# Patient Record
Sex: Female | Born: 1995 | Race: Black or African American | Hispanic: No | Smoking: Never smoker
Health system: Southern US, Community
[De-identification: ages and names within clinical notes are randomized; demographics above are authoritative.]

## PROBLEM LIST (undated history)

## (undated) ENCOUNTER — Inpatient Hospital Stay (HOSPITAL_COMMUNITY): Payer: Self-pay

## (undated) DIAGNOSIS — D649 Anemia, unspecified: Secondary | ICD-10-CM

## (undated) DIAGNOSIS — O139 Gestational [pregnancy-induced] hypertension without significant proteinuria, unspecified trimester: Secondary | ICD-10-CM

## (undated) HISTORY — PX: DENTAL SURGERY: SHX609

## (undated) HISTORY — DX: Anemia, unspecified: D64.9

---

## 2017-02-27 ENCOUNTER — Ambulatory Visit (INDEPENDENT_AMBULATORY_CARE_PROVIDER_SITE_OTHER): Payer: Self-pay

## 2017-02-27 ENCOUNTER — Encounter: Payer: Self-pay | Admitting: Family Medicine

## 2017-02-27 DIAGNOSIS — Z3201 Encounter for pregnancy test, result positive: Secondary | ICD-10-CM

## 2017-02-27 LAB — POCT PREGNANCY, URINE: Preg Test, Ur: POSITIVE — AB

## 2017-02-27 NOTE — Progress Notes (Signed)
Pt here for pregnancy test.  Resulted positive.  Pt reports LMP 11/24/16.  EDD 08/31/17.  Pt given list of medications safe to take in pregnancy.  Proof of pregnancy letter given by the front office.

## 2017-02-28 NOTE — Progress Notes (Signed)
I agree with the nurses note and plan of care.  Venia Carbonasch, Nyesha Cliff I, NP 02/28/2017 10:39 AM

## 2017-03-19 ENCOUNTER — Ambulatory Visit (INDEPENDENT_AMBULATORY_CARE_PROVIDER_SITE_OTHER): Payer: Medicaid Other | Admitting: Family Medicine

## 2017-03-19 ENCOUNTER — Encounter: Payer: Self-pay | Admitting: Family Medicine

## 2017-03-19 ENCOUNTER — Other Ambulatory Visit (HOSPITAL_COMMUNITY)
Admission: RE | Admit: 2017-03-19 | Discharge: 2017-03-19 | Disposition: A | Discharge status: Home / Self Care | Payer: Medicaid Other | Source: Ambulatory Visit | Attending: Family Medicine | Admitting: Family Medicine

## 2017-03-19 VITALS — BP 111/78 | Ht 65.0 in | Wt 220.5 lb

## 2017-03-19 DIAGNOSIS — O0990 Supervision of high risk pregnancy, unspecified, unspecified trimester: Secondary | ICD-10-CM

## 2017-03-19 DIAGNOSIS — Z9189 Other specified personal risk factors, not elsewhere classified: Secondary | ICD-10-CM | POA: Insufficient documentation

## 2017-03-19 DIAGNOSIS — O09299 Supervision of pregnancy with other poor reproductive or obstetric history, unspecified trimester: Secondary | ICD-10-CM

## 2017-03-19 DIAGNOSIS — O09292 Supervision of pregnancy with other poor reproductive or obstetric history, second trimester: Secondary | ICD-10-CM | POA: Diagnosis not present

## 2017-03-19 DIAGNOSIS — Z6221 Child in welfare custody: Secondary | ICD-10-CM

## 2017-03-19 DIAGNOSIS — O09212 Supervision of pregnancy with history of pre-term labor, second trimester: Secondary | ICD-10-CM

## 2017-03-19 DIAGNOSIS — Z8751 Personal history of pre-term labor: Secondary | ICD-10-CM

## 2017-03-19 DIAGNOSIS — O0992 Supervision of high risk pregnancy, unspecified, second trimester: Secondary | ICD-10-CM | POA: Diagnosis present

## 2017-03-19 DIAGNOSIS — Z3A16 16 weeks gestation of pregnancy: Secondary | ICD-10-CM | POA: Diagnosis not present

## 2017-03-19 MED ORDER — ASPIRIN 81 MG PO TABS: 81.0000 mg | ORAL_TABLET | Freq: Every day | ORAL | 2 refills | Status: AC

## 2017-03-19 MED ORDER — ASPIRIN 81 MG PO TABS: 81.0000 mg | ORAL_TABLET | Freq: Every day | ORAL | 2 refills | Status: DC

## 2017-03-19 MED ORDER — RANITIDINE HCL 75 MG PO TABS: 75.0000 mg | ORAL_TABLET | Freq: Two times a day (BID) | ORAL | 2 refills | Status: DC

## 2017-03-19 MED ORDER — PRENATAL VITAMINS 0.8 MG PO TABS: 1.0000 | ORAL_TABLET | Freq: Every day | ORAL | 12 refills | Status: DC

## 2017-03-19 NOTE — Patient Instructions (Signed)
Hydroxyprogesterone solution for injection What is this medicine? HYDROXYPROGESTERONE (hye drox ee proe JES ter one) is a female hormone. This medicine is used in women who are pregnant and who have delivered a baby too early (preterm) in the past. It helps lower the risk of having a preterm baby again. This medicine may be used for other purposes; ask your health care provider or pharmacist if you have questions. COMMON BRAND NAME(S): Makena What should I tell my health care provider before I take this medicine? They need to know if you have any of these conditions: -blood clotting disorders -breast, cervical, uterine, or vaginal cancer -depression -diabetes or prediabetes -heart disease -high blood pressure -kidney disease -liver disease -lung or breathing disease, like asthma -migraine headaches -seizures -vaginal bleeding -an unusual or allergic reaction to hydroxyprogesterone, other hormones, medicines, foods, dyes, castor oil, benzyl alcohol, or other preservatives -breast-feeding How should I use this medicine? This medicine is for injection into a muscle. It is given by a health care professional in a hospital or clinic setting. You are likely to get an injection once a week to prevent preterm delivery. Talk to your pediatrician regarding the use of this medicine in children. Special care may be needed. Overdosage: If you think you have taken too much of this medicine contact a poison control center or emergency room at once. NOTE: This medicine is only for you. Do not share this medicine with others. What if I miss a dose? It is important not to miss your dose. Call your doctor or health care professional if you are unable to keep an appointment. What may interact with this medicine? -acetaminophen -bupropion -clozapine -efavirenz -halothane -methadone -nicotine -theophylline, aminophylline -tizanidine This list may not describe all possible interactions. Give your  health care provider a list of all the medicines, herbs, non-prescription drugs, or dietary supplements you use. Also tell them if you smoke, drink alcohol, or use illegal drugs. Some items may interact with your medicine. What should I watch for while using this medicine? Your condition will be monitored carefully while you are receiving this medicine. What side effects may I notice from receiving this medicine? Side effects that you should report to your doctor or health care professional as soon as possible: -allergic reactions like skin rash, itching or hives, swelling of the face, lips, or tongue -breathing problems -breast tissue changes or discharge -changes in vision -confusion, trouble speaking or understanding -depressed mood -increased hunger or thirst -increased urination -pain, redness, or irritation at site where injected -pain, swelling, warmth in the leg -shortness of breath, chest pain, swelling in a leg -sudden numbness or weakness of the face, arm or leg -sudden severe headaches -trouble walking, dizziness, loss of balance or coordination -unusually weak or tired -vaginal bleeding -yellowing of the eyes or skin Side effects that usually do not require medical attention (report to your doctor or health care professional if they continue or are bothersome): -changes in emotions or moods -diarrhea -fluid retention and swelling -nausea This list may not describe all possible side effects. Call your doctor for medical advice about side effects. You may report side effects to FDA at 1-800-FDA-1088. Where should I keep my medicine? This drug is given in a hospital or clinic and will not be stored at home. NOTE: This sheet is a summary. It may not cover all possible information. If you have questions about this medicine, talk to your doctor, pharmacist, or health care provider.  2018 Elsevier/Gold Standard (2009-06-01 11:17:12)  

## 2017-03-19 NOTE — Progress Notes (Signed)
Pt leaving maternity home. Pt stated need prenatal and Iron and denied flu shot

## 2017-03-19 NOTE — Progress Notes (Signed)
Completed Makena form and faxed and explained to patient once we receive the medicine we will call her to come in to start as soon as possible since she is 16 weeks already.

## 2017-03-19 NOTE — Progress Notes (Signed)
Home Medicaid Form Completed  
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
## 2017-03-20 LAB — COMPREHENSIVE METABOLIC PANEL
A/G RATIO: 1.2 (ref 1.2–2.2)
ALBUMIN: 3.6 g/dL (ref 3.5–5.5)
ALT: 6 IU/L (ref 0–32)
AST: 12 IU/L (ref 0–40)
Alkaline Phosphatase: 68 IU/L (ref 39–117)
BILIRUBIN TOTAL: 0.3 mg/dL (ref 0.0–1.2)
BUN / CREAT RATIO: 17 (ref 9–23)
BUN: 9 mg/dL (ref 6–20)
CALCIUM: 9.4 mg/dL (ref 8.7–10.2)
CHLORIDE: 106 mmol/L (ref 96–106)
CO2: 19 mmol/L — ABNORMAL LOW (ref 20–29)
Creatinine, Ser: 0.54 mg/dL — ABNORMAL LOW (ref 0.57–1.00)
GFR, EST AFRICAN AMERICAN: 156 mL/min/{1.73_m2} (ref >59)
GFR, EST NON AFRICAN AMERICAN: 135 mL/min/{1.73_m2} (ref >59)
GLUCOSE: 86 mg/dL (ref 65–99)
Globulin, Total: 3 g/dL (ref 1.5–4.5)
Potassium: 3.9 mmol/L (ref 3.5–5.2)
Sodium: 137 mmol/L (ref 134–144)
TOTAL PROTEIN: 6.6 g/dL (ref 6.0–8.5)

## 2017-03-20 LAB — OBSTETRIC PANEL, INCLUDING HIV
ANTIBODY SCREEN: NEGATIVE
BASOS: 0 %
Basophils Absolute: 0 10*3/uL (ref 0.0–0.2)
EOS (ABSOLUTE): 0.1 10*3/uL (ref 0.0–0.4)
EOS: 1 %
HEMATOCRIT: 31.4 % — AB (ref 34.0–46.6)
HEMOGLOBIN: 10.2 g/dL — AB (ref 11.1–15.9)
HEP B S AG: NEGATIVE
HIV Screen 4th Generation wRfx: NONREACTIVE
IMMATURE GRANS (ABS): 0 10*3/uL (ref 0.0–0.1)
IMMATURE GRANULOCYTES: 0 %
LYMPHS: 21 %
Lymphocytes Absolute: 2.1 10*3/uL (ref 0.7–3.1)
MCH: 25.4 pg — ABNORMAL LOW (ref 26.6–33.0)
MCHC: 32.5 g/dL (ref 31.5–35.7)
MCV: 78 fL — ABNORMAL LOW (ref 79–97)
MONOCYTES: 5 %
Monocytes Absolute: 0.5 10*3/uL (ref 0.1–0.9)
NEUTROS PCT: 73 %
Neutrophils Absolute: 7.1 10*3/uL — ABNORMAL HIGH (ref 1.4–7.0)
Platelets: 452 10*3/uL — ABNORMAL HIGH (ref 150–379)
RBC: 4.02 x10E6/uL (ref 3.77–5.28)
RDW: 17.5 % — ABNORMAL HIGH (ref 12.3–15.4)
RH TYPE: POSITIVE
RPR Ser Ql: NONREACTIVE
RUBELLA: 16.7 {index} (ref >0.99)
WBC: 9.8 10*3/uL (ref 3.4–10.8)

## 2017-03-20 LAB — PROTEIN / CREATININE RATIO, URINE
Creatinine, Urine: 233.9 mg/dL
PROTEIN/CREAT RATIO: 122 mg/g{creat} (ref 0–200)
Protein, Ur: 28.6 mg/dL

## 2017-03-21 LAB — CYTOLOGY - PAP
Chlamydia: NEGATIVE
DIAGNOSIS: NEGATIVE
HPV (WINDOPATH): NOT DETECTED
Neisseria Gonorrhea: NEGATIVE

## 2017-03-22 ENCOUNTER — Telehealth: Payer: Self-pay

## 2017-03-22 ENCOUNTER — Other Ambulatory Visit: Payer: Self-pay | Admitting: Family Medicine

## 2017-03-22 DIAGNOSIS — Z6221 Child in welfare custody: Secondary | ICD-10-CM | POA: Insufficient documentation

## 2017-03-22 DIAGNOSIS — Z8751 Personal history of pre-term labor: Secondary | ICD-10-CM

## 2017-03-22 MED ORDER — TERCONAZOLE 0.8 % VA CREA: 1.0000 | TOPICAL_CREAM | Freq: Every day | VAGINAL | 0 refills | Status: AC

## 2017-03-22 NOTE — Progress Notes (Signed)
Terconazole vag cream called into pharmacy for yeast infection noted on pap smear.  Raynelle FanningJulie P. Sherronda Sweigert, MD OB Fellow

## 2017-03-22 NOTE — Telephone Encounter (Addendum)
Notified pt that her Donna Wall has arrived and we need to schedule an appt for her to receive her first injection.  I asked if pt could come in this week pt stated that she will have to call back to schedule because she had to check on her ride.  I advised her to call the front office and they can schedule her appt.  Pt stated understanding.

## 2017-03-23 NOTE — Progress Notes (Signed)
Patient called and left message on nurse line wanting to set up appt for injection. Called patient and informed her of yeast infection and medication sent to pharmacy. Patient verbalized understanding and states she cannot come for injection today. Offered 12/4 @ 130 for injection. Patient verbalized understanding and will come then

## 2017-03-26 ENCOUNTER — Encounter (INDEPENDENT_AMBULATORY_CARE_PROVIDER_SITE_OTHER): Payer: Self-pay

## 2017-03-26 ENCOUNTER — Encounter (HOSPITAL_COMMUNITY): Payer: Self-pay | Admitting: Family Medicine

## 2017-04-02 ENCOUNTER — Ambulatory Visit (HOSPITAL_COMMUNITY): Admission: RE | Admit: 2017-04-02 | Payer: Medicaid Other | Source: Ambulatory Visit

## 2017-04-06 ENCOUNTER — Other Ambulatory Visit (HOSPITAL_COMMUNITY): Payer: Self-pay | Admitting: *Deleted

## 2017-04-06 ENCOUNTER — Ambulatory Visit (HOSPITAL_COMMUNITY)
Admission: RE | Admit: 2017-04-06 | Discharge: 2017-04-06 | Disposition: A | Discharge status: Home / Self Care | Payer: Medicaid Other | Source: Ambulatory Visit | Attending: Family Medicine | Admitting: Family Medicine

## 2017-04-06 ENCOUNTER — Other Ambulatory Visit: Payer: Self-pay | Admitting: Family Medicine

## 2017-04-06 ENCOUNTER — Encounter (HOSPITAL_COMMUNITY): Payer: Self-pay

## 2017-04-06 DIAGNOSIS — O09899 Supervision of other high risk pregnancies, unspecified trimester: Secondary | ICD-10-CM

## 2017-04-06 DIAGNOSIS — O09299 Supervision of pregnancy with other poor reproductive or obstetric history, unspecified trimester: Secondary | ICD-10-CM | POA: Diagnosis present

## 2017-04-06 DIAGNOSIS — Z8751 Personal history of pre-term labor: Secondary | ICD-10-CM | POA: Diagnosis present

## 2017-04-06 DIAGNOSIS — Z3A19 19 weeks gestation of pregnancy: Secondary | ICD-10-CM | POA: Diagnosis not present

## 2017-04-06 DIAGNOSIS — O0990 Supervision of high risk pregnancy, unspecified, unspecified trimester: Secondary | ICD-10-CM

## 2017-04-06 DIAGNOSIS — O09219 Supervision of pregnancy with history of pre-term labor, unspecified trimester: Principal | ICD-10-CM

## 2017-04-06 NOTE — Addendum Note (Signed)
Encounter addended by: Raoul PitchMurrow, Galileo Colello Mae on: 04/06/2017 1:42 PM  Actions taken: Imaging Exam ended

## 2017-04-09 ENCOUNTER — Ambulatory Visit (INDEPENDENT_AMBULATORY_CARE_PROVIDER_SITE_OTHER): Payer: Medicaid Other | Admitting: *Deleted

## 2017-04-09 ENCOUNTER — Inpatient Hospital Stay (HOSPITAL_COMMUNITY)
Admission: AD | Admit: 2017-04-09 | Discharge: 2017-04-09 | Disposition: A | Discharge status: Home / Self Care | Payer: Medicaid Other | Source: Ambulatory Visit | Attending: Obstetrics and Gynecology | Admitting: Obstetrics and Gynecology

## 2017-04-09 ENCOUNTER — Encounter (HOSPITAL_COMMUNITY): Payer: Self-pay

## 2017-04-09 VITALS — BP 100/63 | HR 78

## 2017-04-09 DIAGNOSIS — O09212 Supervision of pregnancy with history of pre-term labor, second trimester: Secondary | ICD-10-CM

## 2017-04-09 DIAGNOSIS — M545 Low back pain: Secondary | ICD-10-CM | POA: Diagnosis not present

## 2017-04-09 DIAGNOSIS — O26892 Other specified pregnancy related conditions, second trimester: Secondary | ICD-10-CM | POA: Insufficient documentation

## 2017-04-09 DIAGNOSIS — Z3A19 19 weeks gestation of pregnancy: Secondary | ICD-10-CM

## 2017-04-09 DIAGNOSIS — O9989 Other specified diseases and conditions complicating pregnancy, childbirth and the puerperium: Secondary | ICD-10-CM | POA: Diagnosis not present

## 2017-04-09 DIAGNOSIS — R1030 Lower abdominal pain, unspecified: Secondary | ICD-10-CM | POA: Insufficient documentation

## 2017-04-09 DIAGNOSIS — B379 Candidiasis, unspecified: Secondary | ICD-10-CM

## 2017-04-09 DIAGNOSIS — O99891 Other specified diseases and conditions complicating pregnancy: Secondary | ICD-10-CM

## 2017-04-09 DIAGNOSIS — N949 Unspecified condition associated with female genital organs and menstrual cycle: Secondary | ICD-10-CM

## 2017-04-09 DIAGNOSIS — R102 Pelvic and perineal pain unspecified side: Secondary | ICD-10-CM | POA: Diagnosis present

## 2017-04-09 DIAGNOSIS — M549 Dorsalgia, unspecified: Secondary | ICD-10-CM

## 2017-04-09 DIAGNOSIS — Z7982 Long term (current) use of aspirin: Secondary | ICD-10-CM | POA: Insufficient documentation

## 2017-04-09 DIAGNOSIS — Z8751 Personal history of pre-term labor: Secondary | ICD-10-CM

## 2017-04-09 HISTORY — DX: Gestational (pregnancy-induced) hypertension without significant proteinuria, unspecified trimester: O13.9

## 2017-04-09 LAB — URINALYSIS, ROUTINE W REFLEX MICROSCOPIC
BILIRUBIN URINE: NEGATIVE
GLUCOSE, UA: NEGATIVE mg/dL
HGB URINE DIPSTICK: NEGATIVE
KETONES UR: 20 mg/dL — AB
Leukocytes, UA: NEGATIVE
Nitrite: NEGATIVE
PROTEIN: NEGATIVE mg/dL
Specific Gravity, Urine: 1.028 (ref 1.005–1.030)
pH: 5 (ref 5.0–8.0)

## 2017-04-09 MED ORDER — CYCLOBENZAPRINE HCL 10 MG PO TABS
10.0000 mg | ORAL_TABLET | Freq: Once | ORAL | Status: AC
  Administered 2017-04-09: 10 mg via ORAL
  Filled 2017-04-09: qty 1

## 2017-04-09 MED ORDER — MONISTAT 7 COMPLETE THERAPY 100-2 MG-% VA KIT: 1.0000 | PACK | Freq: Every day | VAGINAL | 0 refills | Status: AC

## 2017-04-09 MED ORDER — CYCLOBENZAPRINE HCL 10 MG PO TABS: 10.0000 mg | ORAL_TABLET | Freq: Two times a day (BID) | ORAL | 0 refills | Status: AC | PRN

## 2017-04-09 MED ORDER — HYDROXYPROGESTERONE CAPROATE 275 MG/1.1ML ~~LOC~~ SOAJ
275.0000 mg | Freq: Once | SUBCUTANEOUS | Status: AC
  Administered 2017-04-09: 275 mg via SUBCUTANEOUS

## 2017-04-09 NOTE — MAU Provider Note (Signed)
History     CSN: [id number]  Arrival date and time: 04/09/17 1549   First Provider Initiated Contact with Patient 04/09/17 1627      Chief Complaint  Patient presents with  . Back Pain  . Abdominal Pain   HPI   Ms. Donna Wall is a 21 y.o. [id number] at 41w3dgestation by LMP presenting to MAU with complaints of lower abdominal pain and mid to lower back pain since 1430 today while at dentist appt. She reports "losing mucous plug" lots of green vaginal d/c. Denies VB or LOF. [place] [facility] [place] [place] [facility] 17-P weekly injection. When she mentioned the mucous d/c to the RN, she was told that it was "not a big deal". She is concerned that something is wrong, because "when she lost mucous plug with daughter, she had her (preterm) a week later".  Past Medical History:  Diagnosis Date  . Anemia   . Pregnancy induced hypertension     Past Surgical History:  Procedure Laterality Date  . DENTAL SURGERY      Family History  Problem Relation Age of Onset  . Hypertension Mother   . High Cholesterol Mother     Social History   Tobacco Use  . Smoking status: Never Smoker  . Smokeless tobacco: Never Used  Substance Use Topics  . Alcohol use: No    Frequency: Never  . Drug use: No    Allergies: No Known Allergies  Medications Prior to Admission  Medication Sig Dispense Refill Last Dose  . aspirin 81 MG tablet Take 1 tablet (81 mg total) by mouth daily. 90 tablet 2 04/09/2017 at Unknown time  . Miconazole Nitrate-Wipes (MONISTAT 7 COMPLETE THERAPY) 100-2 MG-% KIT Place 1 Applicatorful vaginally at bedtime for 7 days. 1 kit 0 not started at Unknown time  . Prenatal Multivit-Min-Fe-FA (PRENATAL VITAMINS) 0.8 MG tablet Take 1 tablet by mouth daily. 30 tablet 12 04/09/2017 at Unknown time  . ranitidine (ZANTAC 75) 75 MG tablet Take 1 tablet (75 mg total) by mouth 2 (two) times daily. 90 tablet 2 04/09/2017 at Unknown time    Review of Systems  Constitutional: Negative.    HENT: Negative.   Eyes: Negative.   Respiratory: Negative.   Cardiovascular: Negative.   Gastrointestinal: Positive for abdominal pain (lower abdominal pain).  Endocrine: Negative.   Genitourinary: Positive for pelvic pain and vaginal discharge.  Musculoskeletal: Positive for back pain.  Skin: Negative.   Allergic/Immunologic: Negative.   Neurological: Negative.   Hematological: Negative.   Psychiatric/Behavioral: Negative.    Physical Exam   Blood pressure 126/77, pulse (!) 107, temperature 98.6 F (37 C), resp. rate 18, height _0  (1.651 m), weight 229 lb (103.9 kg), last menstrual period 11/24/2016.  Physical Exam  Nursing note and vitals reviewed. Constitutional: She is oriented to person, place, and time. She appears well-developed and well-nourished.  HENT:  Head: Normocephalic.  Eyes: Pupils are equal, round, and reactive to light.  Neck: Normal range of motion.  Cardiovascular: Normal rate, regular rhythm and normal heart sounds.  Respiratory: Effort normal and breath sounds normal.  GI: Soft. Bowel sounds are normal.  Genitourinary:  Genitourinary Comments: Uterus: non-tender, cx: smooth, pink, no lesions, moderate amt of thick, mucoid, whitish-yellow vaginal d/c, closed/long/firm, no CMT or friability, no adnexal tenderness   Musculoskeletal: Normal range of motion.  Neurological: She is alert and oriented to person, place, and time.  Skin: Skin is warm and dry.  Psychiatric: She has a normal mood  and affect. Her behavior is normal. Judgment and thought content normal.    MAU Course  Procedures  MDM CCUA FHTs by doppler: 154 bpm  Results for orders placed or performed during the hospital encounter of 04/09/17 (from the past 24 hour(s))  Urinalysis, Routine w reflex microscopic     Status: Abnormal   Collection Time: 04/09/17  4:13 PM  Result Value Ref Range   Color, Urine YELLOW YELLOW   APPearance HAZY (A) CLEAR   Specific Gravity, Urine 1.028 1.005 -  1.030   pH 5.0 5.0 - 8.0   Glucose, UA NEGATIVE NEGATIVE mg/dL   Hgb urine dipstick NEGATIVE NEGATIVE   Bilirubin Urine NEGATIVE NEGATIVE   Ketones, ur 20 (A) NEGATIVE mg/dL   Protein, ur NEGATIVE NEGATIVE mg/dL   Nitrite NEGATIVE NEGATIVE   Leukocytes, UA NEGATIVE NEGATIVE     Assessment and Plan  Back pain affecting pregnancy in second trimester - Rx for Flexeril 10 mg po BID - Information provided on back pain in pregnancy   Pelvic pressure in pregnancy, antepartum, second trimester - Continue 17-P weekly injections  - Keep your scheduled appts  Discharge home Patient verbalized an understanding of the plan of care and agrees.   Laury Deep, MSN, CNM 04/09/2017, 5:36 PM

## 2017-04-09 NOTE — Progress Notes (Signed)
Pt to clinic for 17-p injection, tolerated well. Also requested med for yeast infection, stated terconazole that was prescribed cause her to break out. Monistat 7 sent to her pharmacy.

## 2017-04-09 NOTE — MAU Note (Signed)
Pt reports she is having waves of pain in her back and lower abd pain. abd pain since this morning back pain about 1.5 hours ago. Denies any vaginal bleeding but reports some mucusy discharge(possoble mucus plug). In office today for 17 p shot.

## 2017-04-18 ENCOUNTER — Ambulatory Visit (INDEPENDENT_AMBULATORY_CARE_PROVIDER_SITE_OTHER): Payer: Medicaid Other | Admitting: Family Medicine

## 2017-04-18 ENCOUNTER — Encounter: Payer: Self-pay | Admitting: General Practice

## 2017-04-18 VITALS — BP 119/65 | HR 93 | Wt 232.3 lb

## 2017-04-18 DIAGNOSIS — O09212 Supervision of pregnancy with history of pre-term labor, second trimester: Secondary | ICD-10-CM

## 2017-04-18 DIAGNOSIS — O0992 Supervision of high risk pregnancy, unspecified, second trimester: Secondary | ICD-10-CM

## 2017-04-18 DIAGNOSIS — O09219 Supervision of pregnancy with history of pre-term labor, unspecified trimester: Secondary | ICD-10-CM

## 2017-04-18 DIAGNOSIS — O09899 Supervision of other high risk pregnancies, unspecified trimester: Secondary | ICD-10-CM

## 2017-04-18 DIAGNOSIS — O0990 Supervision of high risk pregnancy, unspecified, unspecified trimester: Secondary | ICD-10-CM

## 2017-04-18 MED ORDER — HYDROXYPROGESTERONE CAPROATE 275 MG/1.1ML ~~LOC~~ SOAJ
275.0000 mg | Freq: Once | SUBCUTANEOUS | Status: AC
  Administered 2017-04-18: 275 mg via SUBCUTANEOUS

## 2017-04-18 NOTE — Patient Instructions (Signed)
Eating Plan for Pregnant Women While you are pregnant, your body will require additional nutrition to help support your growing baby. It is recommended that you consume:  150 additional calories each day during your first trimester.  300 additional calories each day during your second trimester.  300 additional calories each day during your third trimester.  Eating a healthy, well-balanced diet is very important for your health and for your baby's health. You also have a higher need for some vitamins and minerals, such as folic acid, calcium, iron, and vitamin D. What do I need to know about eating during pregnancy?  Do not try to lose weight or go on a diet during pregnancy.  Choose healthy, nutritious foods. Choose  of a sandwich with a glass of milk instead of a candy bar or a high-calorie sugar-sweetened beverage.  Limit your overall intake of foods that have "empty calories." These are foods that have little nutritional value, such as sweets, desserts, candies, sugar-sweetened beverages, and fried foods.  Eat a variety of foods, especially fruits and vegetables.  Take a prenatal vitamin to help meet the additional needs during pregnancy, specifically for folic acid, iron, calcium, and vitamin D.  Remember to stay active. Ask your health care provider for exercise recommendations that are specific to you.  Practice good food safety and cleanliness, such as washing your hands before you eat and after you prepare raw meat. This helps to prevent foodborne illnesses, such as listeriosis, that can be very dangerous for your baby. Ask your health care provider for more information about listeriosis. What does 150 extra calories look like? Healthy options for an additional 150 calories each day could be any of the following:  Plain low-fat yogurt (6-8 oz) with  cup of berries.  1 apple with 2 teaspoons of peanut butter.  Cut-up vegetables with  cup of hummus.  Low-fat chocolate  milk (8 oz or 1 cup).  1 string cheese with 1 medium orange.   of a peanut butter and jelly sandwich on whole-wheat bread (1 tsp of peanut butter).  For 300 calories, you could eat two of those healthy options each day. What is a healthy amount of weight to gain? The recommended amount of weight for you to gain is based on your pre-pregnancy BMI. If your pre-pregnancy BMI was:  Less than 18 (underweight), you should gain 28-40 lb.  18-24.9 (normal), you should gain 25-35 lb.  25-29.9 (overweight), you should gain 15-25 lb.  Greater than 30 (obese), you should gain 11-20 lb.  What if I am having twins or multiples? Generally, pregnant women who will be having twins or multiples may need to increase their daily calories by 300-600 calories each day. The recommended range for total weight gain is 25-54 lb, depending on your pre-pregnancy BMI. Talk with your health care provider for specific guidance about additional nutritional needs, weight gain, and exercise during your pregnancy. What foods can I eat? Grains Any grains. Try to choose whole grains, such as whole-wheat bread, oatmeal, or brown rice. Vegetables Any vegetables. Try to eat a variety of colors and types of vegetables to get a full range of vitamins and minerals. Remember to wash your vegetables well before eating. Fruits Any fruits. Try to eat a variety of colors and types of fruit to get a full range of vitamins and minerals. Remember to wash your fruits well before eating. Meats and Other Protein Sources Lean meats, including chicken, turkey, fish, and lean cuts of beef, veal,   or pork. Make sure that all meats are cooked to "well done." Tofu. Tempeh. Beans. Eggs. Peanut butter and other nut butters. Seafood, such as shrimp, crab, and lobster. If you choose fish, select types that are higher in omega-3 fatty acids, including salmon, herring, mussels, trout, sardines, and pollock. Make sure that all meats are cooked to  food-safe temperatures. Dairy Pasteurized milk and milk alternatives. Pasteurized yogurt and pasteurized cheese. Cottage cheese. Sour cream. Beverages Water. Juices that contain 100% fruit juice or vegetable juice. Caffeine-free teas and decaffeinated coffee. Drinks that contain caffeine are okay to drink, but it is better to avoid caffeine. Keep your total caffeine intake to less than 200 mg each day (12 oz of coffee, tea, or soda) or as directed by your health care provider. Condiments Any pasteurized condiments. Sweets and Desserts Any sweets and desserts. Fats and Oils Any fats and oils. The items listed above may not be a complete list of recommended foods or beverages. Contact your dietitian for more options. What foods are not recommended? Vegetables Unpasteurized (raw) vegetable juices. Fruits Unpasteurized (raw) fruit juices. Meats and Other Protein Sources Cured meats that have nitrates, such as bacon, salami, and hotdogs. Luncheon meats, bologna, or other deli meats (unless they are reheated until they are steaming hot). Refrigerated pate, meat spreads from a meat counter, smoked seafood that is found in the refrigerated section of a store. Raw fish, such as sushi or sashimi. High mercury content fish, such as tilefish, shark, swordfish, and king mackerel. Raw meats, such as tuna or beef tartare. Undercooked meats and poultry. Make sure that all meats are cooked to food-safe temperatures. Dairy Unpasteurized (raw) milk and any foods that have raw milk in them. Soft cheeses, such as feta, queso blanco, queso fresco, Brie, Camembert cheeses, blue-veined cheeses, and Panela cheese (unless it is made with pasteurized milk, which must be stated on the label). Beverages Alcohol. Sugar-sweetened beverages, such as sodas, teas, or energy drinks. Condiments Homemade fermented foods and drinks, such as pickles, sauerkraut, or kombucha drinks. (Store-bought pasteurized versions of these are  okay.) Other Salads that are made in the store, such as ham salad, chicken salad, egg salad, tuna salad, and seafood salad. The items listed above may not be a complete list of foods and beverages to avoid. Contact your dietitian for more information. This information is not intended to replace advice given to you by your health care provider. Make sure you discuss any questions you have with your health care provider. Document Released: 01/23/2014 Document Revised: 09/16/2015 Document Reviewed: 09/23/2013 Elsevier Interactive Patient Education  2018 [employer]   
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
## 2017-04-18 NOTE — Addendum Note (Signed)
Addended by: Lorelle GibbsWILSON, Calianne Larue L on: 04/18/2017 12:49 PM   Modules accepted: Orders

## 2017-04-20 LAB — AFP TETRA
DIA Mom Value: 0.93
DIA Value (EIA): 142.67 pg/mL
DSR (BY AGE) 1 IN: 1121
DSR (SECOND TRIMESTER) 1 IN: 10000
Gestational Age: 20 WEEKS
MSAFP MOM: 1.34
MSAFP: 63.2 ng/mL
MSHCG Mom: 0.46
MSHCG: 8115 m[IU]/mL
Maternal Age At EDD: 22.3 yr
OSB RISK: 8546
T18 (By Age): 1:4368 {titer}
TEST RESULTS AFP: NEGATIVE
WEIGHT: 232 [lb_av]
uE3 Mom: 1.17
uE3 Value: 1.9 ng/mL

## 2017-04-23 ENCOUNTER — Ambulatory Visit (HOSPITAL_COMMUNITY)
Admission: RE | Admit: 2017-04-23 | Discharge: 2017-04-23 | Disposition: A | Discharge status: Home / Self Care | Payer: Medicaid Other | Source: Ambulatory Visit | Attending: Family Medicine | Admitting: Family Medicine

## 2017-04-23 ENCOUNTER — Other Ambulatory Visit (HOSPITAL_COMMUNITY): Payer: Self-pay | Admitting: *Deleted

## 2017-04-23 ENCOUNTER — Encounter (HOSPITAL_COMMUNITY): Payer: Self-pay

## 2017-04-23 ENCOUNTER — Other Ambulatory Visit (HOSPITAL_COMMUNITY): Payer: Self-pay | Admitting: Obstetrics and Gynecology

## 2017-04-23 DIAGNOSIS — O09219 Supervision of pregnancy with history of pre-term labor, unspecified trimester: Principal | ICD-10-CM

## 2017-04-23 DIAGNOSIS — O09299 Supervision of pregnancy with other poor reproductive or obstetric history, unspecified trimester: Secondary | ICD-10-CM | POA: Insufficient documentation

## 2017-04-23 DIAGNOSIS — Z3686 Encounter for antenatal screening for cervical length: Secondary | ICD-10-CM | POA: Diagnosis not present

## 2017-04-23 DIAGNOSIS — O09899 Supervision of other high risk pregnancies, unspecified trimester: Secondary | ICD-10-CM

## 2017-04-23 DIAGNOSIS — Z3A21 21 weeks gestation of pregnancy: Secondary | ICD-10-CM

## 2017-05-07 ENCOUNTER — Encounter (HOSPITAL_COMMUNITY): Payer: Self-pay

## 2017-05-07 ENCOUNTER — Other Ambulatory Visit (HOSPITAL_COMMUNITY): Payer: Self-pay | Admitting: Maternal and Fetal Medicine

## 2017-05-07 ENCOUNTER — Ambulatory Visit (HOSPITAL_COMMUNITY)
Admission: RE | Admit: 2017-05-07 | Discharge: 2017-05-07 | Disposition: A | Discharge status: Home / Self Care | Payer: Medicaid Other | Source: Ambulatory Visit | Attending: Family Medicine | Admitting: Family Medicine

## 2017-05-07 ENCOUNTER — Other Ambulatory Visit (HOSPITAL_COMMUNITY): Payer: Self-pay | Admitting: *Deleted

## 2017-05-07 DIAGNOSIS — O09212 Supervision of pregnancy with history of pre-term labor, second trimester: Secondary | ICD-10-CM | POA: Diagnosis not present

## 2017-05-07 DIAGNOSIS — Z3A23 23 weeks gestation of pregnancy: Secondary | ICD-10-CM | POA: Diagnosis not present

## 2017-05-07 DIAGNOSIS — M549 Dorsalgia, unspecified: Secondary | ICD-10-CM

## 2017-05-07 DIAGNOSIS — N949 Unspecified condition associated with female genital organs and menstrual cycle: Secondary | ICD-10-CM

## 2017-05-07 DIAGNOSIS — O9989 Other specified diseases and conditions complicating pregnancy, childbirth and the puerperium: Secondary | ICD-10-CM

## 2017-05-07 DIAGNOSIS — Z8751 Personal history of pre-term labor: Secondary | ICD-10-CM

## 2017-05-07 DIAGNOSIS — Z3686 Encounter for antenatal screening for cervical length: Secondary | ICD-10-CM | POA: Diagnosis not present

## 2017-05-07 DIAGNOSIS — O09219 Supervision of pregnancy with history of pre-term labor, unspecified trimester: Secondary | ICD-10-CM

## 2017-05-07 DIAGNOSIS — O26892 Other specified pregnancy related conditions, second trimester: Secondary | ICD-10-CM

## 2017-05-07 DIAGNOSIS — O09292 Supervision of pregnancy with other poor reproductive or obstetric history, second trimester: Secondary | ICD-10-CM | POA: Diagnosis not present

## 2017-05-07 DIAGNOSIS — R102 Pelvic and perineal pain: Secondary | ICD-10-CM

## 2017-05-07 NOTE — ED Notes (Signed)
Patient states she is receiving weekly 17P injs., but it is not on her med list.

## 2017-05-07 NOTE — Addendum Note (Signed)
Encounter addended by: Lenise ArenaBazemore, Tamra Koos, RDMS on: 05/07/2017 9:52 AM  Actions taken: Imaging Exam ended

## 2017-05-09 ENCOUNTER — Encounter: Payer: Self-pay | Admitting: *Deleted

## 2017-05-15 ENCOUNTER — Encounter: Payer: Medicaid Other | Admitting: Family Medicine

## 2017-05-21 ENCOUNTER — Other Ambulatory Visit (HOSPITAL_COMMUNITY): Payer: Self-pay | Admitting: Obstetrics and Gynecology

## 2017-05-21 ENCOUNTER — Encounter (HOSPITAL_COMMUNITY): Payer: Self-pay

## 2017-05-21 ENCOUNTER — Other Ambulatory Visit (HOSPITAL_COMMUNITY): Payer: Self-pay | Admitting: *Deleted

## 2017-05-21 ENCOUNTER — Ambulatory Visit (HOSPITAL_COMMUNITY)
Admission: RE | Admit: 2017-05-21 | Discharge: 2017-05-21 | Disposition: A | Discharge status: Home / Self Care | Payer: Medicaid Other | Source: Ambulatory Visit | Attending: Family Medicine | Admitting: Family Medicine

## 2017-05-21 ENCOUNTER — Encounter (INDEPENDENT_AMBULATORY_CARE_PROVIDER_SITE_OTHER): Payer: Self-pay

## 2017-05-21 DIAGNOSIS — O99212 Obesity complicating pregnancy, second trimester: Secondary | ICD-10-CM | POA: Diagnosis not present

## 2017-05-21 DIAGNOSIS — O09299 Supervision of pregnancy with other poor reproductive or obstetric history, unspecified trimester: Secondary | ICD-10-CM

## 2017-05-21 DIAGNOSIS — O09212 Supervision of pregnancy with history of pre-term labor, second trimester: Secondary | ICD-10-CM | POA: Insufficient documentation

## 2017-05-21 DIAGNOSIS — Z3A25 25 weeks gestation of pregnancy: Secondary | ICD-10-CM

## 2017-05-21 DIAGNOSIS — O09292 Supervision of pregnancy with other poor reproductive or obstetric history, second trimester: Secondary | ICD-10-CM | POA: Insufficient documentation

## 2017-05-21 DIAGNOSIS — Z3686 Encounter for antenatal screening for cervical length: Secondary | ICD-10-CM

## 2017-05-21 DIAGNOSIS — O09899 Supervision of other high risk pregnancies, unspecified trimester: Secondary | ICD-10-CM

## 2017-05-21 DIAGNOSIS — Z362 Encounter for other antenatal screening follow-up: Secondary | ICD-10-CM | POA: Diagnosis not present

## 2017-05-21 DIAGNOSIS — Z8751 Personal history of pre-term labor: Secondary | ICD-10-CM

## 2017-05-21 DIAGNOSIS — O09219 Supervision of pregnancy with history of pre-term labor, unspecified trimester: Principal | ICD-10-CM

## 2017-05-21 NOTE — Telephone Encounter (Signed)
mychart message sent to pt to call us for apt

## 2017-05-22 ENCOUNTER — Telehealth: Payer: Self-pay | Admitting: Family Medicine

## 2017-05-22 NOTE — Telephone Encounter (Signed)
Called patient to get her scheduled, she said she was moving from [place] and she will have her records requested, no other appts here in our office

## 2017-05-27 ENCOUNTER — Encounter (HOSPITAL_COMMUNITY): Payer: Self-pay | Admitting: *Deleted

## 2017-05-27 ENCOUNTER — Inpatient Hospital Stay (HOSPITAL_COMMUNITY)
Admission: AD | Admit: 2017-05-27 | Discharge: 2017-05-27 | Disposition: A | Discharge status: Home / Self Care | Payer: Medicaid Other | Source: Ambulatory Visit | Attending: Obstetrics & Gynecology | Admitting: Obstetrics & Gynecology

## 2017-05-27 DIAGNOSIS — Z3A26 26 weeks gestation of pregnancy: Secondary | ICD-10-CM | POA: Diagnosis not present

## 2017-05-27 DIAGNOSIS — R102 Pelvic and perineal pain: Secondary | ICD-10-CM | POA: Insufficient documentation

## 2017-05-27 DIAGNOSIS — O26892 Other specified pregnancy related conditions, second trimester: Secondary | ICD-10-CM | POA: Insufficient documentation

## 2017-05-27 DIAGNOSIS — Z7982 Long term (current) use of aspirin: Secondary | ICD-10-CM | POA: Diagnosis not present

## 2017-05-27 DIAGNOSIS — R1030 Lower abdominal pain, unspecified: Secondary | ICD-10-CM | POA: Diagnosis present

## 2017-05-27 DIAGNOSIS — N949 Unspecified condition associated with female genital organs and menstrual cycle: Secondary | ICD-10-CM

## 2017-05-27 LAB — URINALYSIS, ROUTINE W REFLEX MICROSCOPIC
Bilirubin Urine: NEGATIVE
Glucose, UA: NEGATIVE mg/dL
Hgb urine dipstick: NEGATIVE
KETONES UR: NEGATIVE mg/dL
Nitrite: NEGATIVE
PROTEIN: NEGATIVE mg/dL
Specific Gravity, Urine: 1.027 (ref 1.005–1.030)
pH: 6 (ref 5.0–8.0)

## 2017-05-27 MED ORDER — COMFORT FIT MATERNITY SUPP MED MISC: 1 refills | Status: AC

## 2017-05-27 NOTE — Discharge Instructions (Signed)
You have pubic symphysis pain which is a normal discomfort of pregnancy. Get a maternity support belt and wear it for less pain but it will not take away all of your pain. It is important for you to keep moving and to do some stretching exercises every day. Be sure to be in touch with your social worker. Make an appointment and be seen in the clinic if you are in [place].

## 2017-05-27 NOTE — MAU Note (Signed)
Pt presents with c/o pain in sharp right hip that shoots down her leg and into her foot with movement that began last week. Also reports lower abdominal cramping.  Denies VB or LOF.  Reports +FM.

## 2017-05-27 NOTE — MAU Provider Note (Signed)
History     CSN: [id number]  Arrival date and time: 05/27/17 1236   First Provider Initiated Contact with Patient 05/27/17 1322      Chief Complaint  Patient presents with  . Abdominal Pain  . Hip Pain  . Leg Pain   HPI Donna Wall 22 y.o. [redacted]w[redacted]d  Comes to MAU today with lower abdominal pain and pain in her right leg that goes from her hip to her anterior thigh and to her knee.  Has pain when walking and when lying on her side in bed.  Has taken 17P shots in this pregnancy but none in the past 4 weeks.  Has had 2 preterm births at 34 and 29 weeks.  Told the clinic she was moving and has not been seen in the clinic since Dec 26.  Client is talking almost constantly.  States she does not know what she is going to do.  She did leave [place] but then friends picked her up and returned her to [place], but she has no where to stay.  States her support in this pregnancy is uncertain.  Currently she is living at [employer] and has a Child psychotherapist who is helping her fill out paperwork to find somewhere to live in [place] during the pregnancy.  Client has her debit card lying beside the bed and states she was planning to use it to purchase food.  OB History    Gravida Para Term Preterm AB Living   3 2   2   2    SAB TAB Ectopic Multiple Live Births           2      Obstetric Comments   2016 - Induced for Pre-eclampsia  2017 - PTL      Past Medical History:  Diagnosis Date  . Anemia   . Pregnancy induced hypertension     Past Surgical History:  Procedure Laterality Date  . DENTAL SURGERY      Family History  Problem Relation Age of Onset  . Hypertension Mother   . High Cholesterol Mother   . Hypertension Maternal Grandmother   . Diabetes Maternal Grandfather     Social History   Tobacco Use  . Smoking status: Never Smoker  . Smokeless tobacco: Never Used  Substance Use Topics  . Alcohol use: No    Frequency: Never  . Drug use: No    Allergies: No Known  Allergies  Medications Prior to Admission  Medication Sig Dispense Refill Last Dose  . aspirin 81 MG tablet Take 1 tablet (81 mg total) by mouth daily. 90 tablet 2 Taking  . cyclobenzaprine (FLEXERIL) 10 MG tablet Take 1 tablet (10 mg total) by mouth 2 (two) times daily as needed for muscle spasms. 20 tablet 0 Taking  . Prenatal Multivit-Min-Fe-FA (PRENATAL VITAMINS) 0.8 MG tablet Take 1 tablet by mouth daily. 30 tablet 12 Taking  . ranitidine (ZANTAC 75) 75 MG tablet Take 1 tablet (75 mg total) by mouth 2 (two) times daily. 90 tablet 2 Taking    Review of Systems  Constitutional: Negative for fever.  Gastrointestinal: Positive for abdominal pain. Negative for nausea and vomiting.  Genitourinary: Negative for dysuria, vaginal bleeding and vaginal discharge.  Neurological: Negative for headaches.   Physical Exam   Blood pressure (!) 140/57, pulse (!) 101, temperature 98.3 F (36.8 C), height 5' 5.5" (1.664 m), weight 247 lb (112 kg), last menstrual period 11/24/2016. Repeat BP was 119/79, pulse 88  Physical Exam  Nursing note and vitals reviewed. Constitutional: She is oriented to person, place, and time. She appears well-developed and well-nourished.  HENT:  Head: Normocephalic.  Eyes: EOM are normal.  Neck: Neck supple.  GI: Soft. There is no rebound and no guarding.  Has lower abdominal tenderness.  No contractions are palpated.  On the fetal monitor.FHT baseline is 150 with moderate variability.  Has 10x10 accels with is appropriate at this gestational age.  No decelerations.  No contractions.  Reassuring strip. Has pubic symphysis tenderness.  Musculoskeletal: Normal range of motion.  Able to stand and walk unassisted.  Neurological: She is alert and oriented to person, place, and time.  Skin: Skin is warm and dry.  Psychiatric: She has a normal mood and affect.    MAU Course  Procedures Results for orders placed or performed during the hospital encounter of 05/27/17  (from the past 24 hour(s))  Urinalysis, Routine w reflex microscopic     Status: Abnormal   Collection Time: 05/27/17 12:42 PM  Result Value Ref Range   Color, Urine YELLOW YELLOW   APPearance CLOUDY (A) CLEAR   Specific Gravity, Urine 1.027 1.005 - 1.030   pH 6.0 5.0 - 8.0   Glucose, UA NEGATIVE NEGATIVE mg/dL   Hgb urine dipstick NEGATIVE NEGATIVE   Bilirubin Urine NEGATIVE NEGATIVE   Ketones, ur NEGATIVE NEGATIVE mg/dL   Protein, ur NEGATIVE NEGATIVE mg/dL   Nitrite NEGATIVE NEGATIVE   Leukocytes, UA LARGE (A) NEGATIVE   RBC / HPF 0-5 0 - 5 RBC/hpf   WBC, UA 6-30 0 - 5 WBC/hpf   Bacteria, UA RARE (A) NONE SEEN   Squamous Epithelial / LPF 6-30 (A) NONE SEEN   Mucus PRESENT     MDM Client has classic symptoms for pubic symphysis discomfort by history and on exam. She reports she is not having contractions today.  Strongly advised her to return to the clinic for prenatal care while she is still in [place] and sent a message to the clinic to contact her to be seen again. Client reports she hopes to be leaving the area soon.  Is investigating a pregnancy home in [place].  Reviewed prior ultrasound results - previously has not had cervical shortening and client is not having contractions today.  Assessment and Plan  Pelvic pain in pregnancy due to Public symphysis pain  Reassuring NST  - no contractions  Plan Advised to get a pregnancy support belt - given a printed prescription. Advised to be seen again in clinic - likely needs to continue 17P?? Keep moving and to do some stretching exercises every day. Be sure to be in touch with your social worker. Take Tylenol 325 mg 2 tablets by mouth every 4 hours if needed for pain. Drink at least 8 8-oz glasses of water every day. Return if you are having contractions, vaginal bleeding or leaking of fluid.  Terri L Burleson 05/27/2017, 1:31 PM

## 2017-05-27 NOTE — MAU Note (Signed)
Patient not in lobby

## 2017-05-27 NOTE — MAU Note (Signed)
Patient presents with onset of sharp pelvic pain and now is radiating down her right leg.  Cramping like a period. Denies vaginal bleeding. Mucus discharge

## 2017-05-29 ENCOUNTER — Telehealth: Payer: Self-pay | Admitting: General Practice

## 2017-05-29 NOTE — Telephone Encounter (Signed)
-----   Message from Currie Pariserri L Burleson, NP sent at 05/27/2017  1:43 PM EST ----- Regarding: Needs Prenatal appt and 17P Client said she was moving but in fact she has been in and out of town.  Currently still in [place] and has not been seen in 4 weeks.  Has not had her 17P in January.  Please try to schedule her ASAP.  Will need to see Asher MuirJamie also.  States she is homeless.

## 2017-07-02 ENCOUNTER — Ambulatory Visit (HOSPITAL_COMMUNITY)
Admission: RE | Admit: 2017-07-02 | Discharge: 2017-07-02 | Disposition: A | Discharge status: Home / Self Care | Payer: Medicaid Other | Source: Ambulatory Visit | Attending: Family Medicine | Admitting: Family Medicine

## 2017-07-02 ENCOUNTER — Encounter (HOSPITAL_COMMUNITY): Payer: Self-pay

## 2017-07-10 ENCOUNTER — Telehealth: Payer: Self-pay

## 2017-07-10 NOTE — Telephone Encounter (Signed)
Pt called requesting a refill on her medication but did not say which one.

## 2017-07-12 ENCOUNTER — Other Ambulatory Visit: Payer: Self-pay

## 2017-07-12 DIAGNOSIS — O0990 Supervision of high risk pregnancy, unspecified, unspecified trimester: Secondary | ICD-10-CM

## 2017-07-12 MED ORDER — PRENATAL VITAMINS 0.8 MG PO TABS: 1.0000 | ORAL_TABLET | Freq: Every day | ORAL | 2 refills | Status: AC

## 2017-07-12 MED ORDER — RANITIDINE HCL 75 MG PO TABS: 75.0000 mg | ORAL_TABLET | Freq: Two times a day (BID) | ORAL | 0 refills | Status: DC

## 2017-07-12 MED ORDER — RANITIDINE HCL 75 MG PO TABS: 75.0000 mg | ORAL_TABLET | Freq: Two times a day (BID) | ORAL | 0 refills | Status: AC

## 2017-07-12 MED ORDER — PRENATAL VITAMINS 0.8 MG PO TABS: 1.0000 | ORAL_TABLET | Freq: Every day | ORAL | 12 refills | Status: DC

## 2017-07-12 NOTE — Telephone Encounter (Signed)
Received call from patient regarding medication refill on prenatal and zantac. Patient stated she has moved to [place] and can not see a provider for another couple of weeks. I have advised her I will call in the rx with one refill but she will need to follow up with her new ob provider for any additional. Patient voice understanding.

## 2017-11-23 ENCOUNTER — Encounter (HOSPITAL_COMMUNITY): Payer: Self-pay

## 2019-04-28 IMAGING — US US MFM OB TRANSVAGINAL
1 series · 15 of 23 positions shown · non-contrast
Comparison: none

[Series 1: us mfm ob transvaginal · 23 acquisitions, 15 frames shown]
[im 1/23]
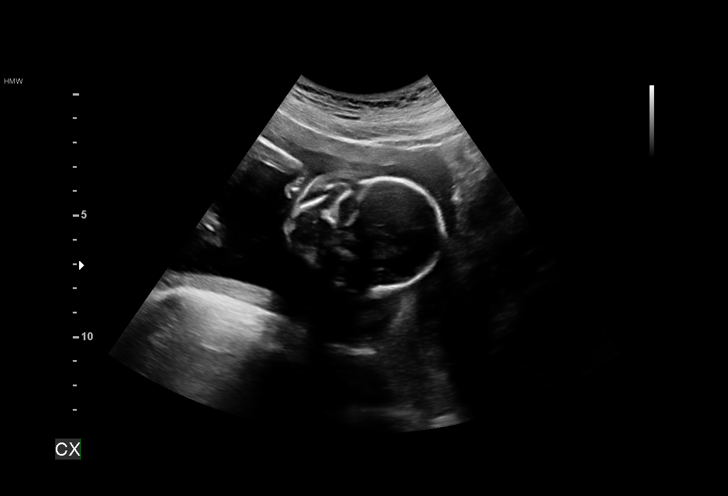
[im 3/23]
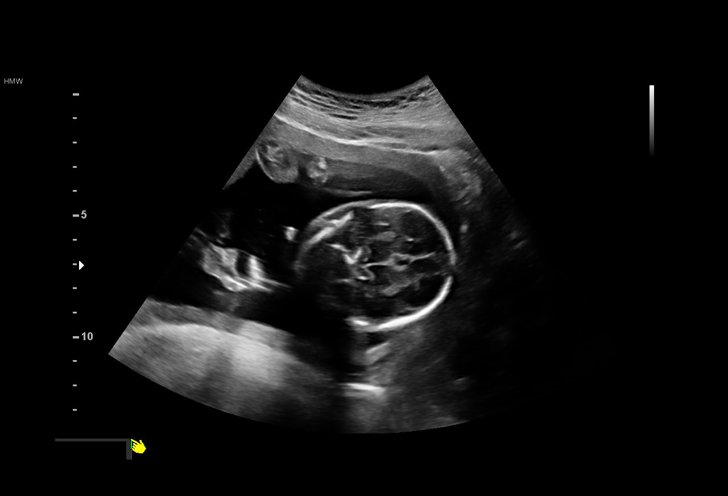
[im 4/23]
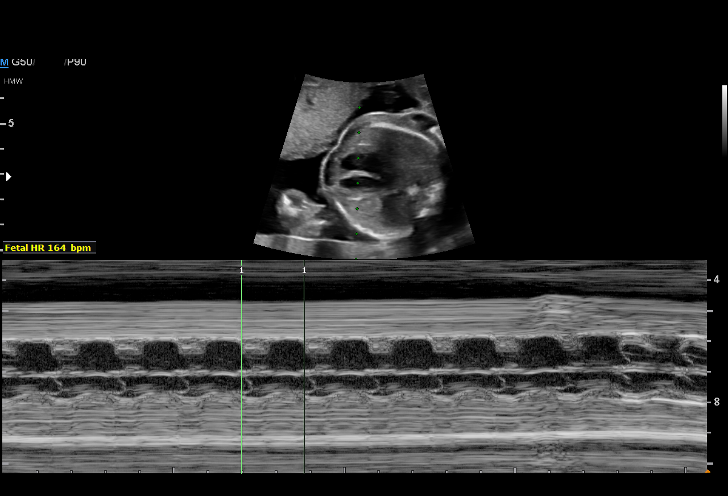
[im 6/23]
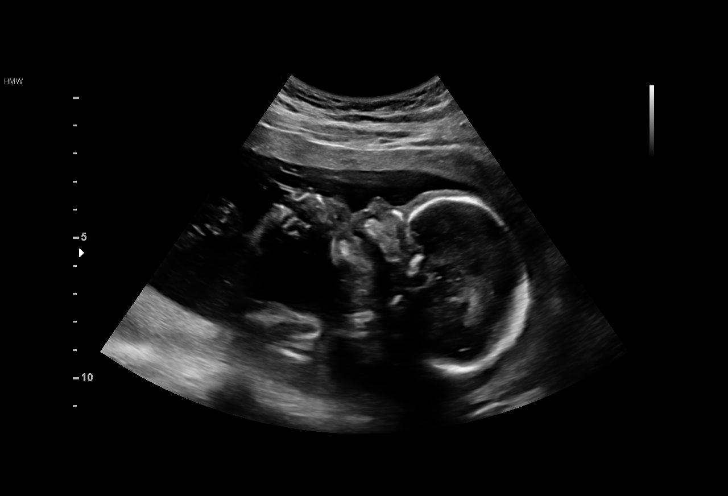
[im 7/23]
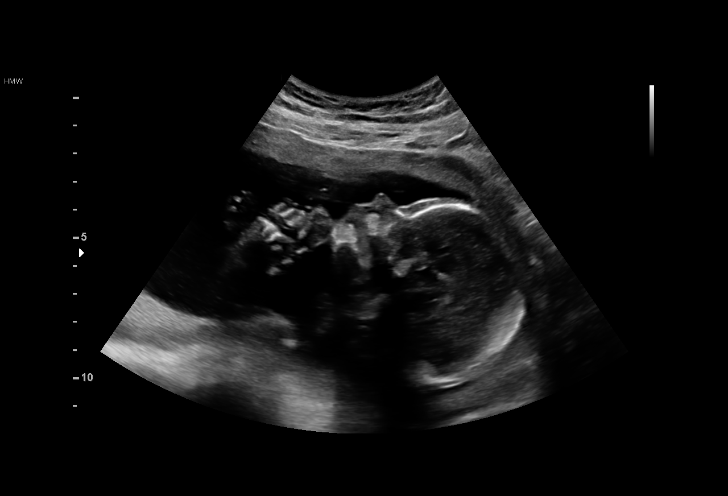
[im 9/23]
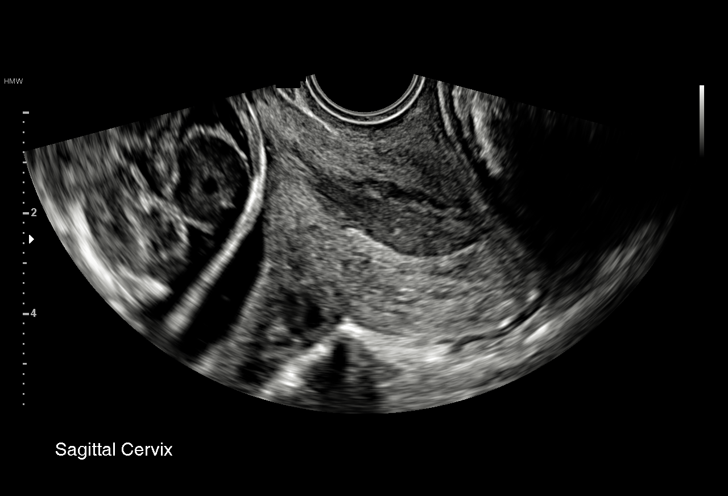
[im 10/23]
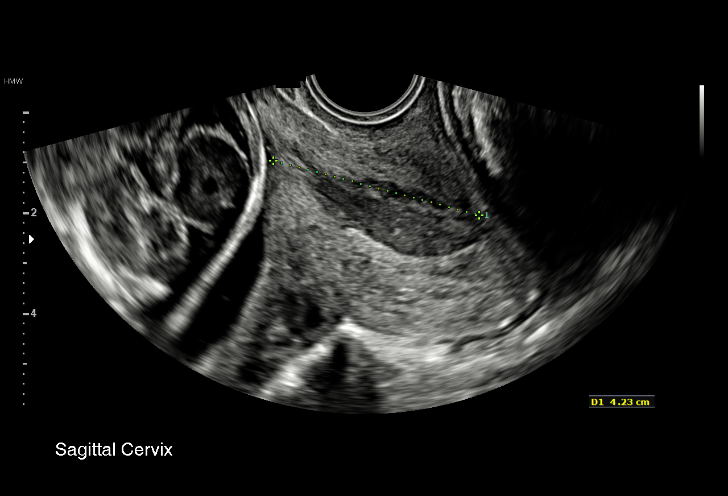
[im 12/23]
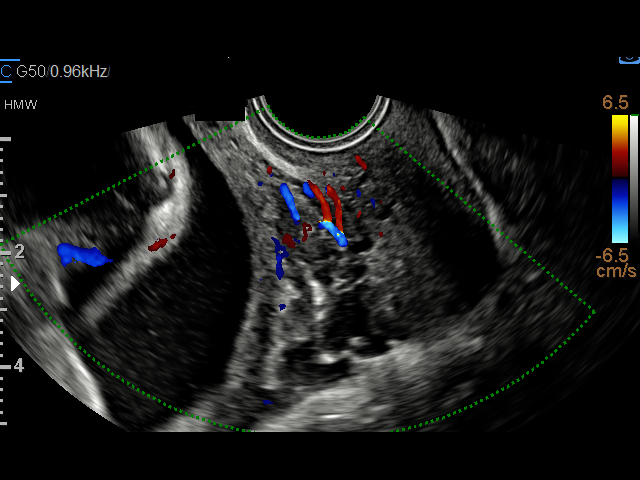
[im 14/23]
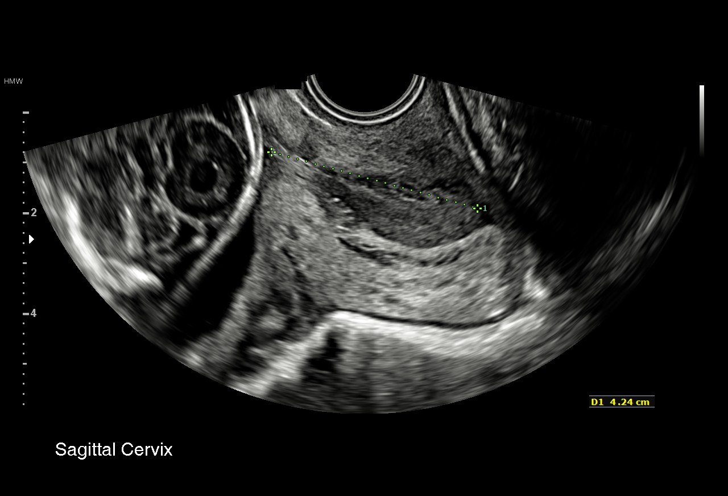
[im 15/23]
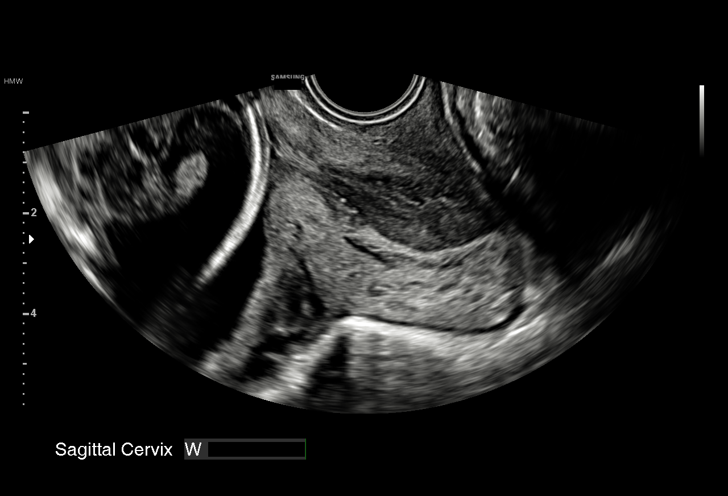
[im 17/23]
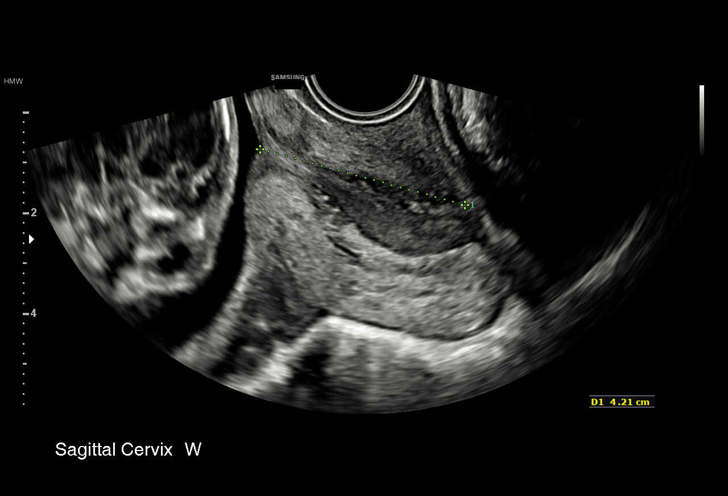
[im 18/23]
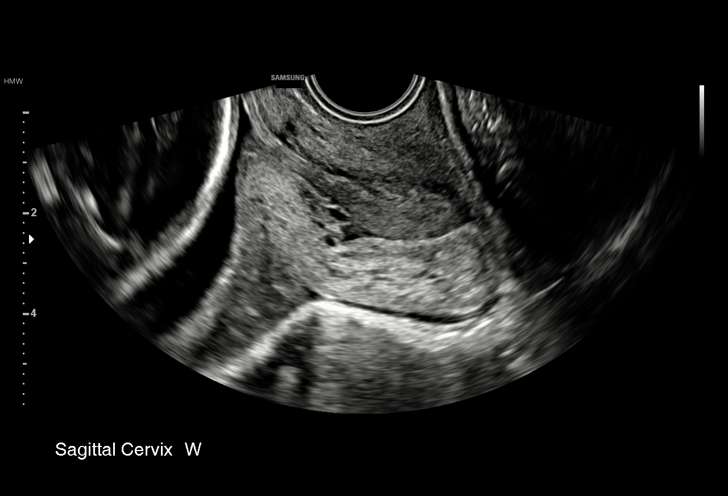
[im 20/23]
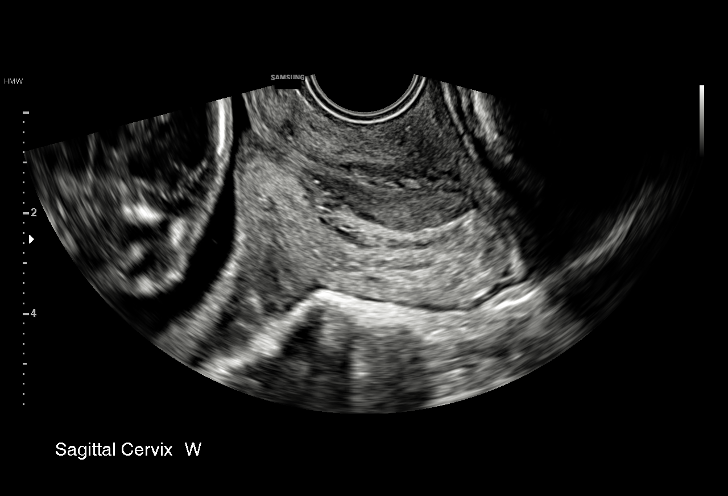
[im 21/23]
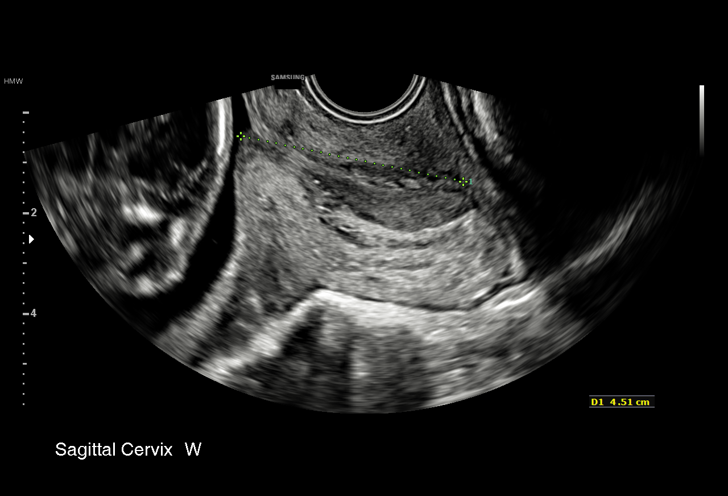
[im 23/23]
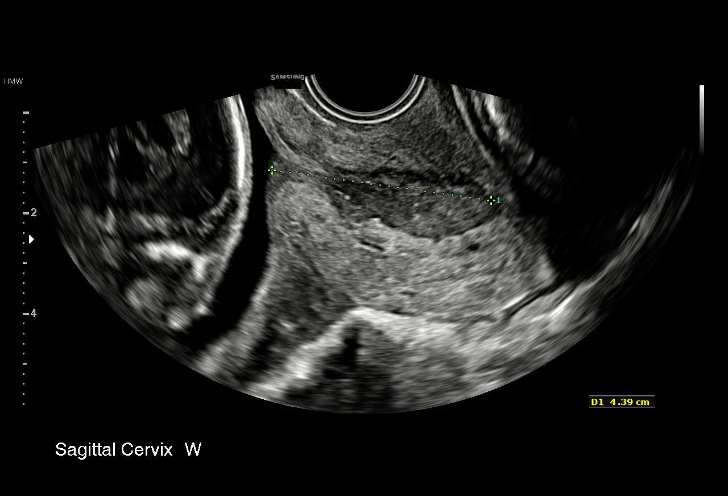

[15 of 23 positions shown; findings below may reference images not displayed]

OB/Gyn Clinic

1  DIPAK JULIANO              [id number]      [id number]     [id number]
Indications

21 weeks gestation of pregnancy
Poor obstetric history: Previous
preeclampsia / eclampsia/gestational HTN
Poor obstetric history: Previous preterm
delivery, antepartum; NB weights: 3+4 and
4+7; weekly 17P
Encounter for cervical length
OB History

Gravidity:    3         Term:   0        Prem:   2        SAB:   0
TOP:          0       Ectopic:  0        Living: 2
Fetal Evaluation

Num Of Fetuses:     1
Fetal Heart         164
Rate(bpm):
Cardiac Activity:   Observed
Presentation:       Cephalic

Amniotic Fluid
AFI FV:      Subjectively within normal limits
Gestational Age

LMP:           21w 3d        Date:  11/24/16                 EDD:   08/31/17
Best:          21w 3d     Det. By:  LMP  (11/24/16)          EDD:   08/31/17
Cervix Uterus Adnexa

Cervix
Length:            4.2  cm.
Normal appearance by transvaginal scan. Appears closed, without
funnelling.
Impression

SIUP at 21+3 weeks
Normal amniotic fluid volume
EV views of cervix: normal length without funneling
Recommendations

Follow-up ultrasound for cervical length in 2 weeks

## 2019-06-03 ENCOUNTER — Encounter: Payer: Self-pay | Admitting: *Deleted
